# Patient Record
Sex: Female | Born: 1965 | Race: Black or African American | Hispanic: No | Smoking: Never smoker
Health system: Southern US, Community
[De-identification: ages and names within clinical notes are randomized; demographics above are authoritative.]

## PROBLEM LIST (undated history)

## (undated) HISTORY — PX: BREAST EXCISIONAL BIOPSY: SUR124

---

## 2007-05-26 ENCOUNTER — Ambulatory Visit (HOSPITAL_COMMUNITY): Admission: RE | Admit: 2007-05-26 | Discharge: 2007-05-26 | Payer: Self-pay | Admitting: Obstetrics and Gynecology

## 2007-12-03 ENCOUNTER — Encounter: Admission: RE | Admit: 2007-12-03 | Discharge: 2007-12-03 | Payer: Self-pay | Admitting: Surgery

## 2007-12-03 ENCOUNTER — Encounter (INDEPENDENT_AMBULATORY_CARE_PROVIDER_SITE_OTHER): Payer: Self-pay | Admitting: Diagnostic Radiology

## 2007-12-03 ENCOUNTER — Other Ambulatory Visit: Admission: RE | Admit: 2007-12-03 | Discharge: 2007-12-03 | Payer: Self-pay | Admitting: Diagnostic Radiology

## 2007-12-03 ENCOUNTER — Encounter (INDEPENDENT_AMBULATORY_CARE_PROVIDER_SITE_OTHER): Payer: Self-pay | Admitting: Interventional Radiology

## 2008-01-11 ENCOUNTER — Emergency Department (HOSPITAL_COMMUNITY): Admission: EM | Admit: 2008-01-11 | Discharge: 2008-01-11 | Payer: Self-pay | Admitting: Family Medicine

## 2008-01-14 ENCOUNTER — Ambulatory Visit: Payer: Self-pay | Admitting: Family Medicine

## 2008-01-22 ENCOUNTER — Ambulatory Visit: Payer: Self-pay | Admitting: Family Medicine

## 2009-02-20 IMAGING — US US SOFT TISSUE HEAD/NECK
1 series · 13 of 25 positions shown · non-contrast
Comparison: None.

CLINICAL DATA: Thyroid nodule on physical examination.

THYROID ULTRASOUND
TECHNIQUE: Ultrasound examination of the thyroid gland and adjacent
soft tissues was performed.

[Series 1: unknown · 0.11mm/px · 13 of 53 slices shown]
[im 1/53]
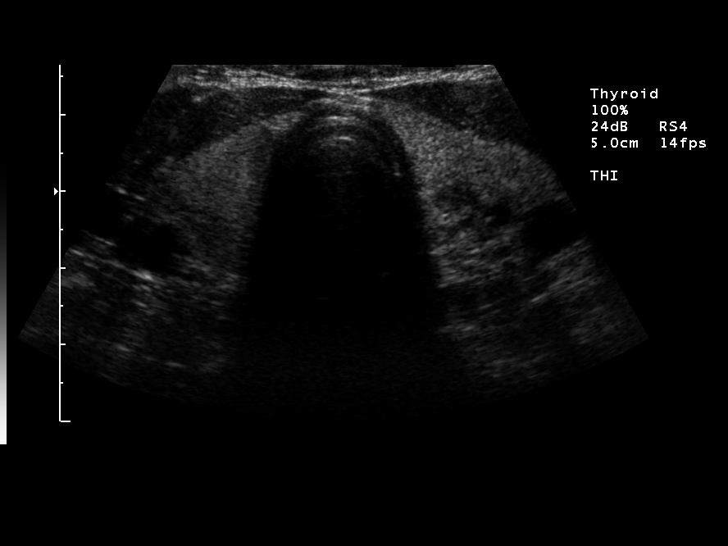
[im 5/53]
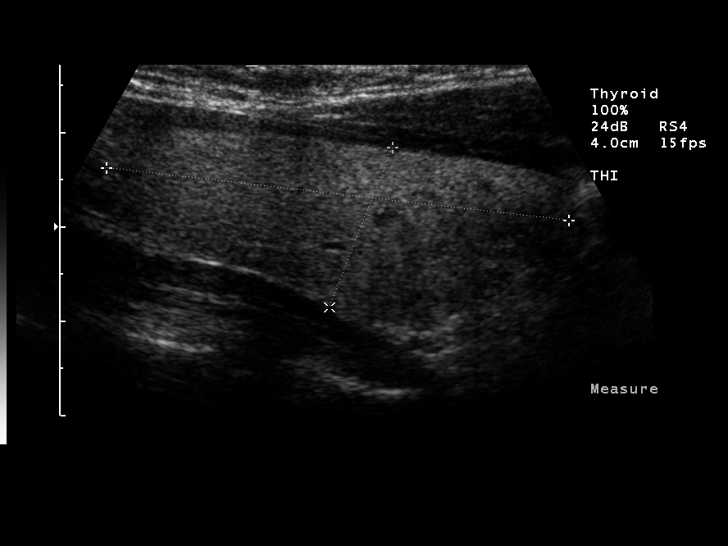
[im 9/53]
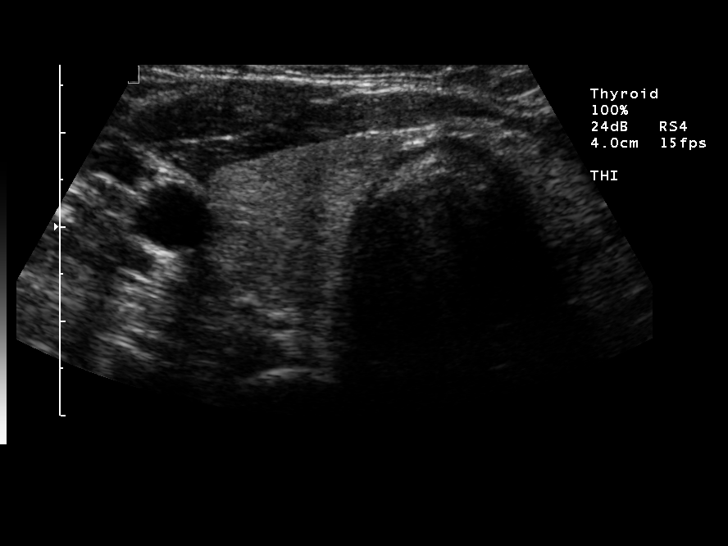
[im 14/53]
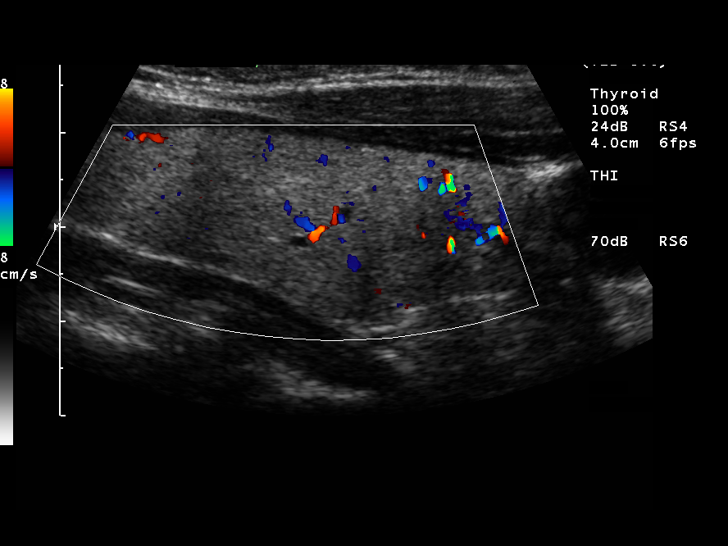
[im 18/53]
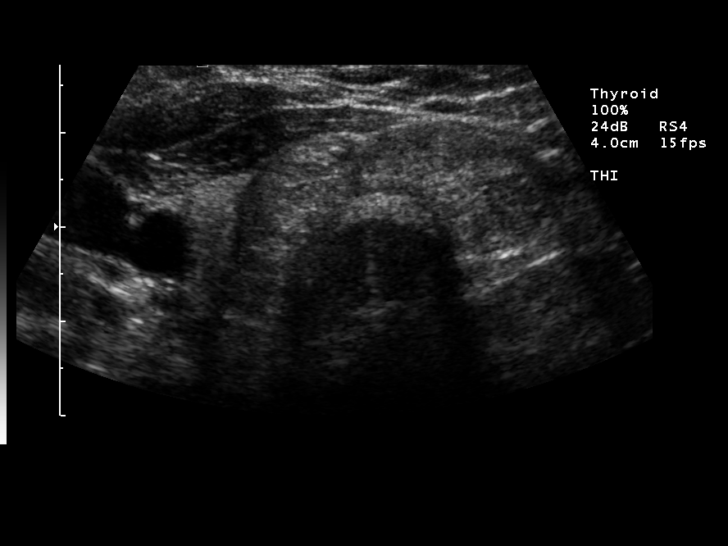
[im 22/53]
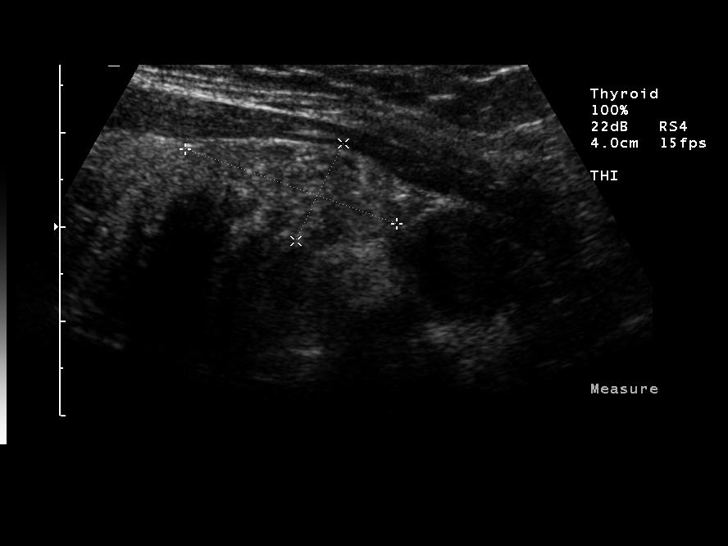
[im 27/53]
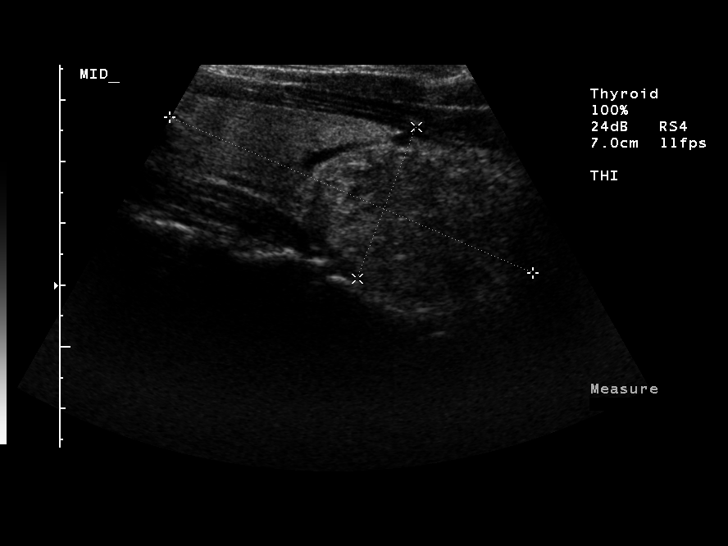
[im 31/53]
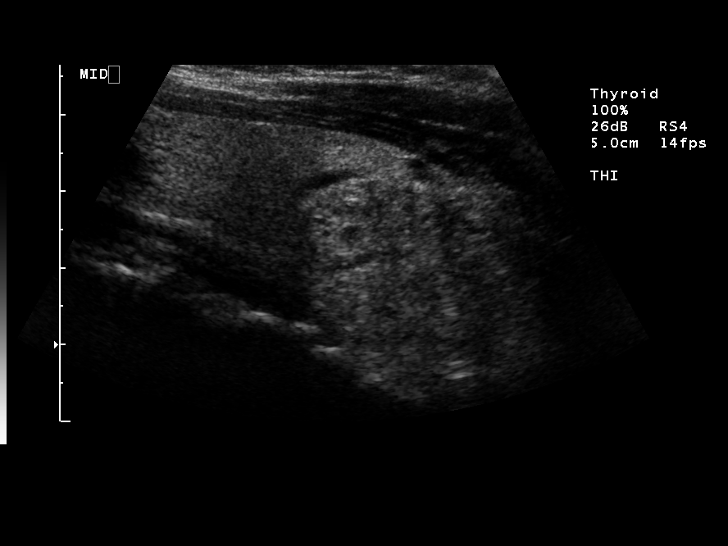
[im 35/53]
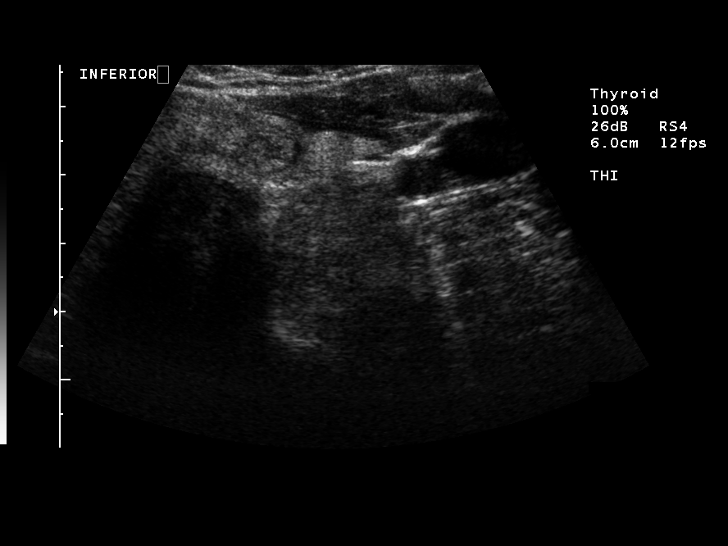
[im 40/53]
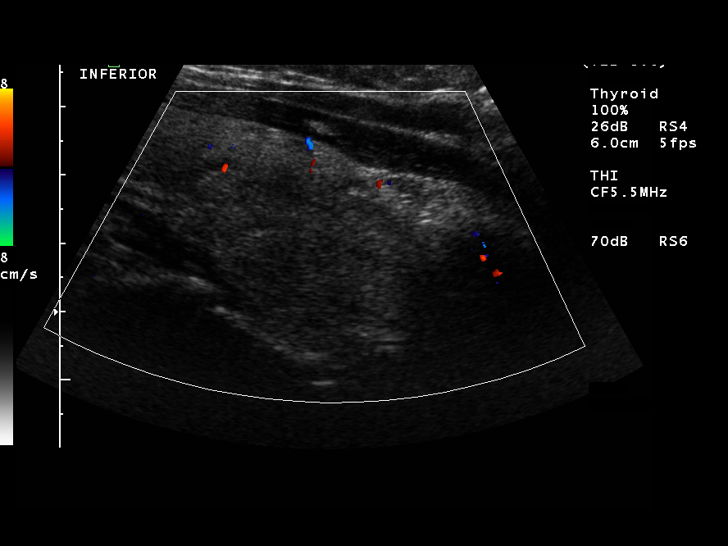
[im 44/53]
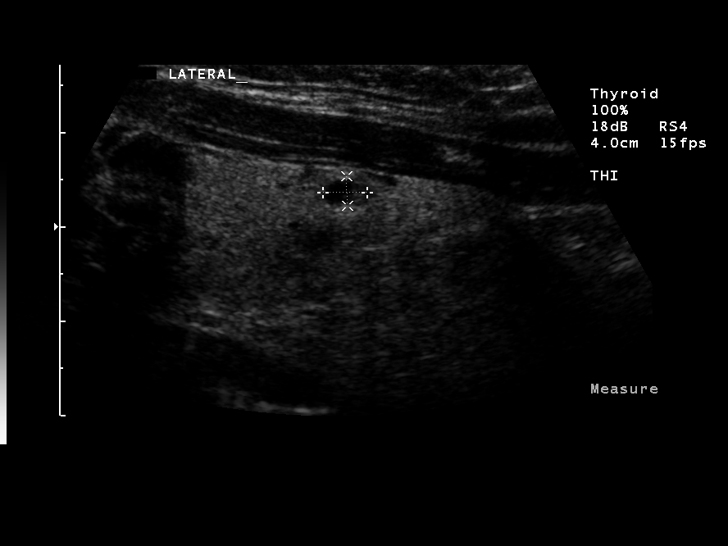
[im 48/53]
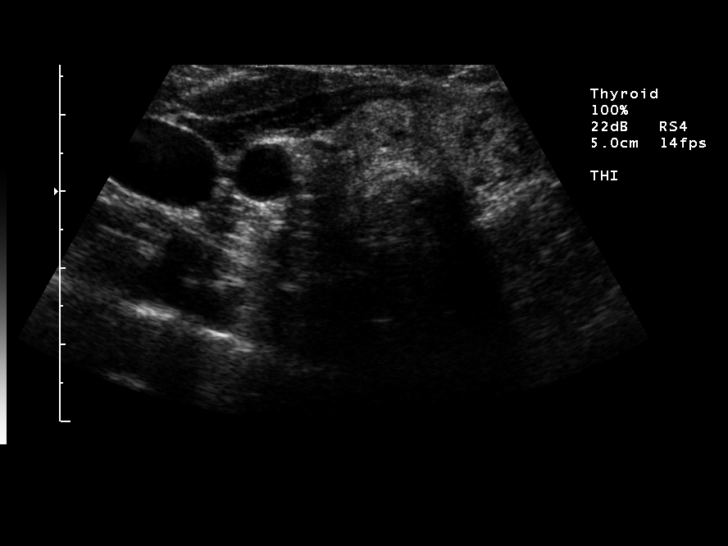
[im 53/53]
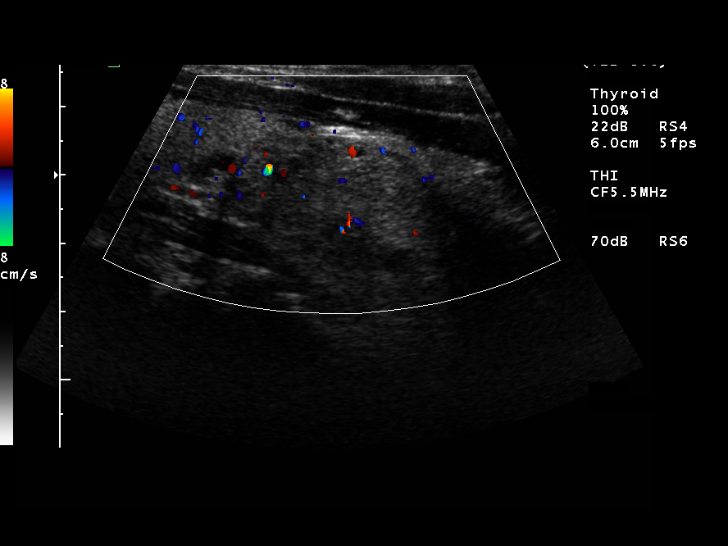

[13 of 25 positions shown; findings below may reference images not displayed]

FINDINGS: The left lobe of the thyroid gland is elongated by a
large mass arising from the inferior portion of the gland.  The
isthmus is also enlarged by a mass.  Otherwise, the thyroid gland
is normal in size.  The right lobe measures 4.9 x 1.8 x 1.6 cm in
maximum dimensions.  The left lobe measures 6.4 x 2.6 x 2.3 cm in
maximum dimensions.  The isthmus measures 9.6 mm in thickness in
the midline.

The mass arising from the inferior aspect of the left lobe measures
4.2 x 2.8 x 2.5 cm in maximum dimensions.  The mass in the isthmus
measures 2.8 x 2.4 x 1.2 cm in maximum dimensions.  There are
additional smaller masses in both lobes. The largest of these
masses is in the mid left lobe, measuring 1.0 cm in maximum
diameter.
IMPRESSION: Multinodular goiter, including a dominant inferior left lobe mass
measuring 4.2 cm in maximum diameter and dominant isthmus mass
measuring 2.8 cm in maximum diameter.  Consideration of ultrasound-
guided needle biopsy of both of these masses is recommended.

## 2009-03-16 ENCOUNTER — Ambulatory Visit: Payer: Self-pay | Admitting: Family Medicine

## 2009-08-30 IMAGING — US US BIOPSY
1 series · 14 of 16 positions shown · non-contrast
Comparison: none

CLINICAL HISTORY: Thyroid nodules.  Dominant nodules involving the
left lower pole and isthmus

[Series 1: us biopsy · 0.09mm/px · 16 acquisitions, 14 frames shown]
[im 1/16]
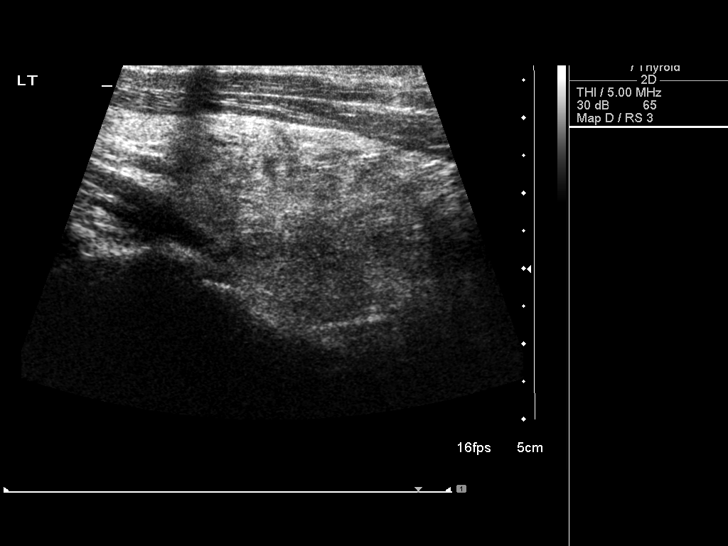
[im 2/16]
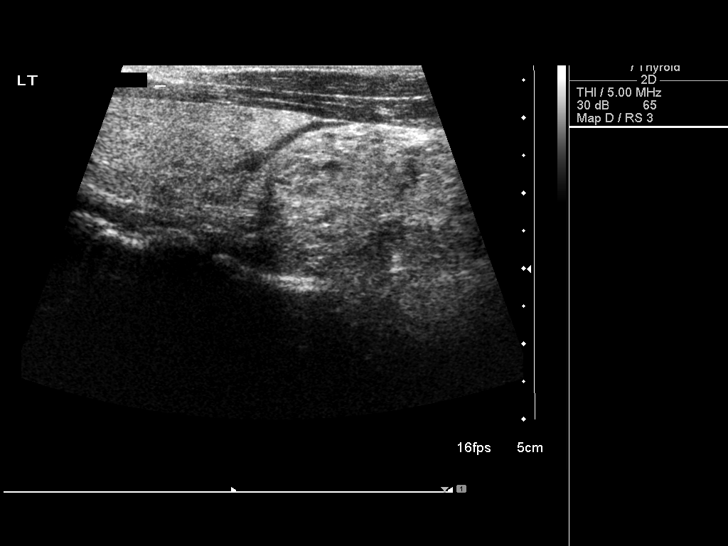
[im 3/16]
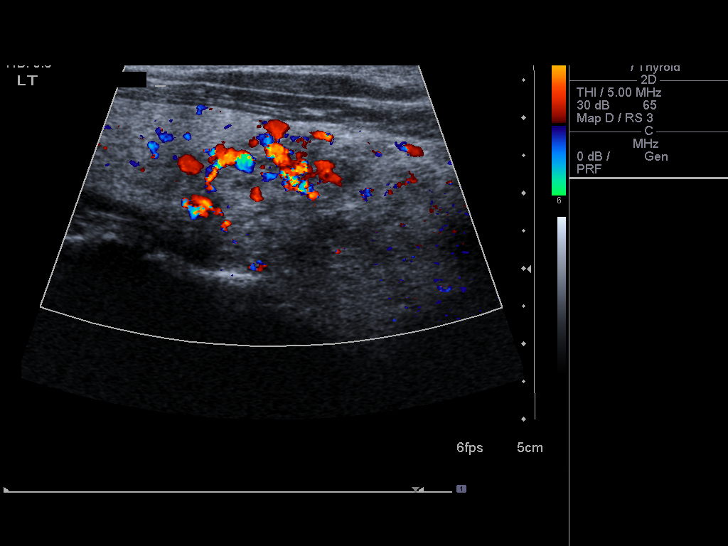
[im 5/16]
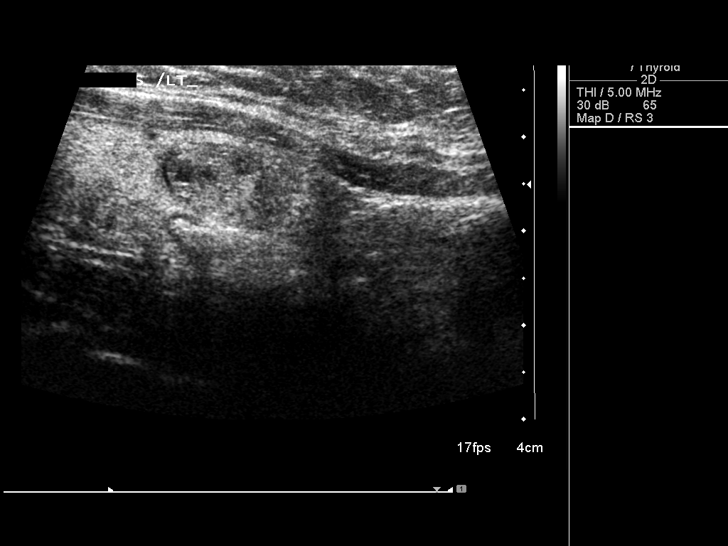
[im 6/16]
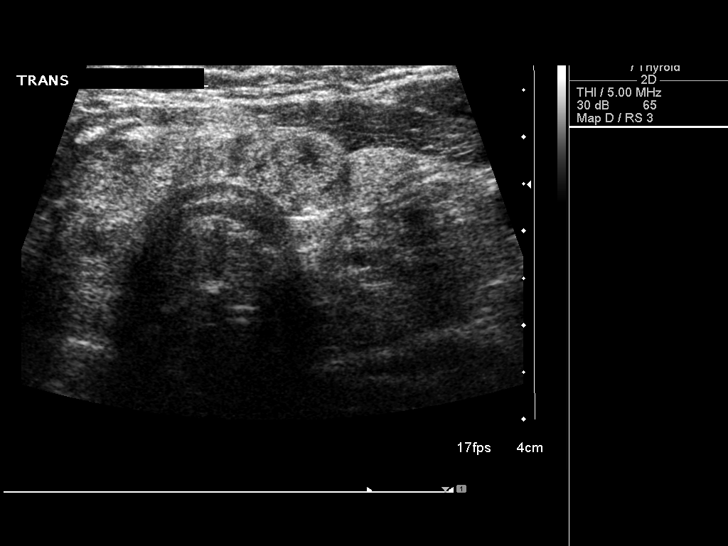
[im 7/16]
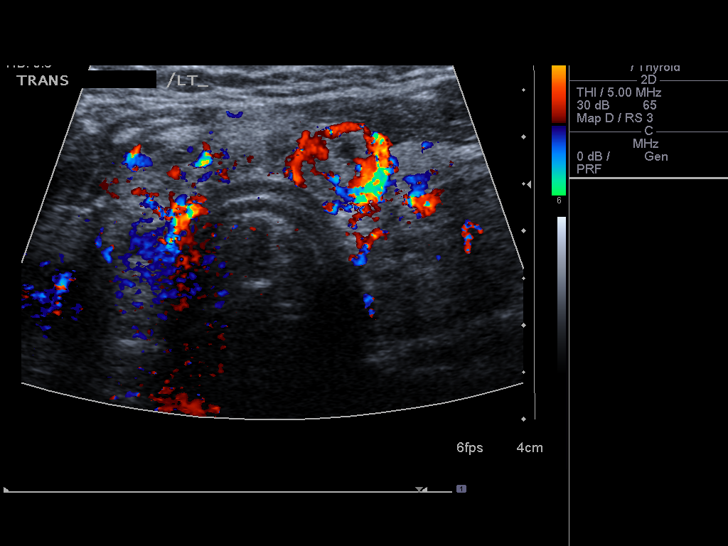
[im 8/16]
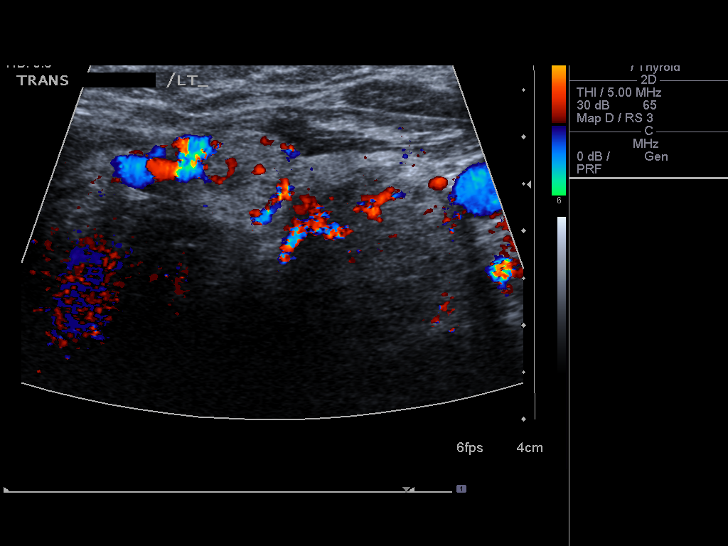
[im 9/16]
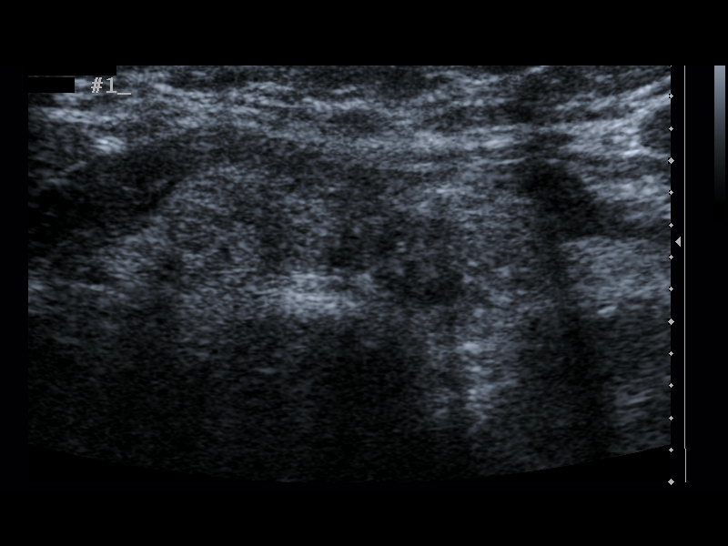
[im 10/16]
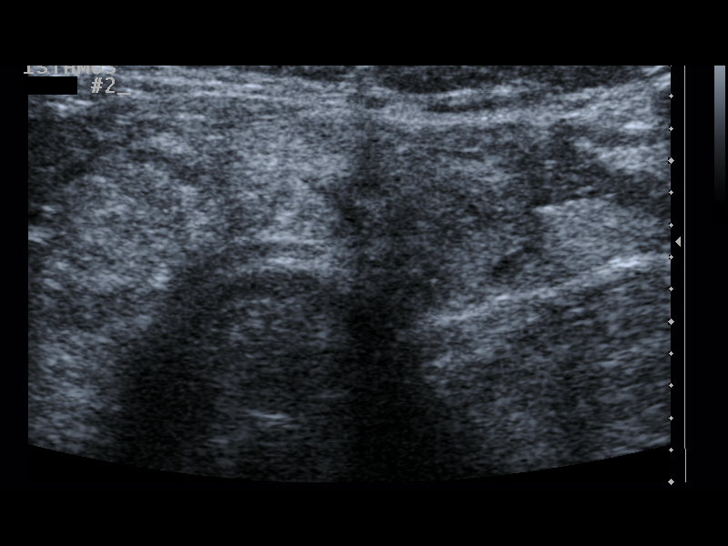
[im 11/16]
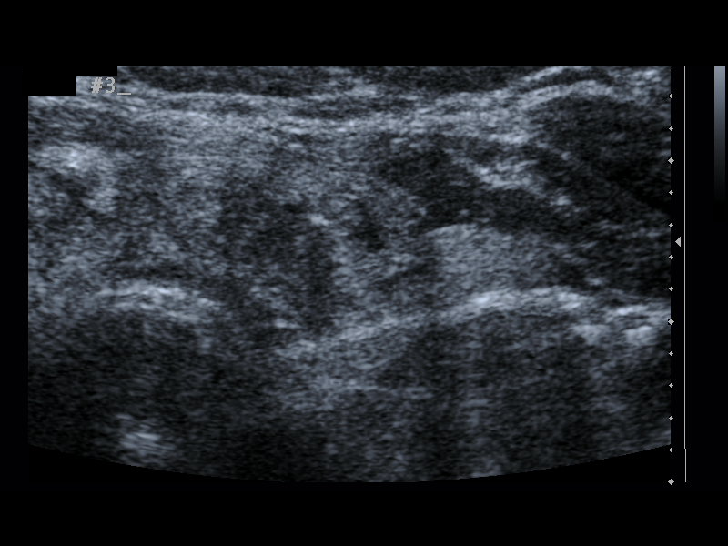
[im 13/16]
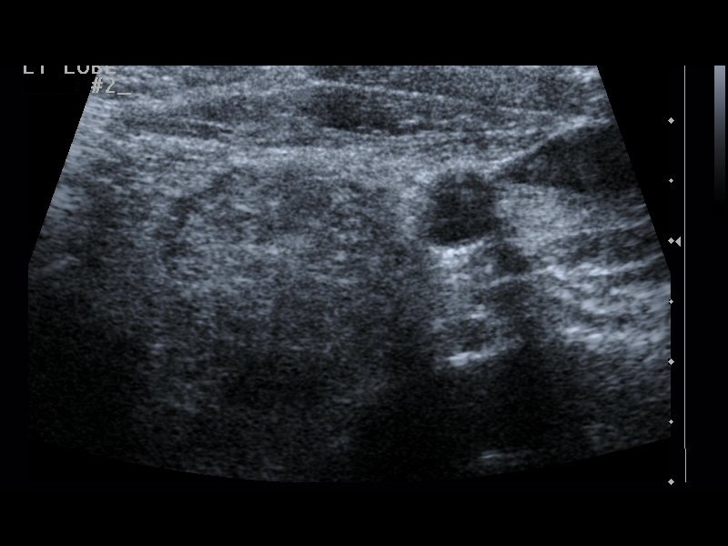
[im 14/16]
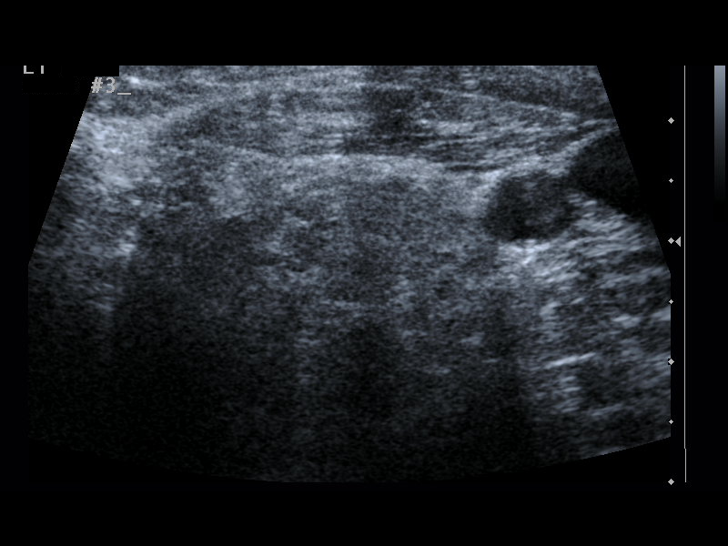
[im 15/16]
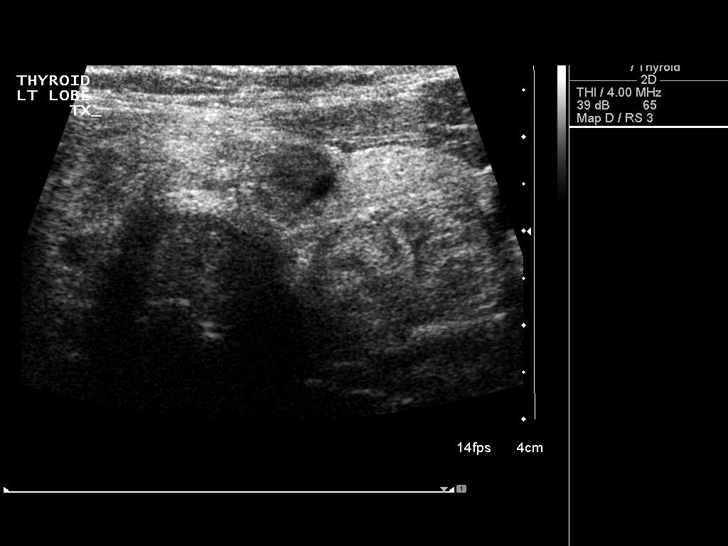
[im 16/16]
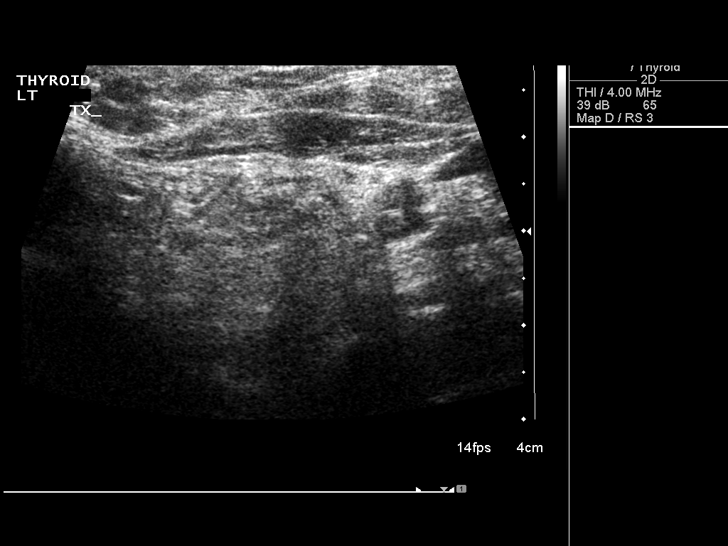

[14 of 16 positions shown; findings below may reference images not displayed]

PROCEDURE(S): ULTRASOUND GUIDED FINE NEEDLE ASPIRATIONS OF TWO
THYROID NODULES

Medications:None

Sedation time:None

Procedure:Consent was obtained for ultrasound guided thyroid
biopsies.  Ultrasound demonstrated the nodules of concern.  The
neck was prepped with Betadine and a sterile drape was placed.
The neck was anesthetized with 1% lidocaine.  Using ultrasound
guidance, the heterogeneous nodule along left side of the isthmus
was sampled three times with 25 gauge needles.  The larger nodule
along the left lower pole was sampled 3 times with 25 gauge
needles.  No immediate complications.
FINDINGS: Large heterogeneous nodule along the lower pole of the
left thyroid lobe.  Contiguous nodules involving the isthmus gland.
The left side of this nodular structure was biopsied.
IMPRESSION: Ultrasound guided fine needle aspirations of the isthmus
and left thyroid lobe nodules.

## 2010-08-07 ENCOUNTER — Other Ambulatory Visit (HOSPITAL_COMMUNITY): Payer: Self-pay | Admitting: Obstetrics and Gynecology

## 2010-08-07 DIAGNOSIS — E049 Nontoxic goiter, unspecified: Secondary | ICD-10-CM

## 2010-08-10 ENCOUNTER — Ambulatory Visit (HOSPITAL_COMMUNITY)
Admission: RE | Admit: 2010-08-10 | Discharge: 2010-08-10 | Disposition: A | Discharge status: Home / Self Care | Payer: Self-pay | Source: Ambulatory Visit | Attending: Obstetrics and Gynecology | Admitting: Obstetrics and Gynecology

## 2010-08-10 DIAGNOSIS — E049 Nontoxic goiter, unspecified: Secondary | ICD-10-CM

## 2011-11-07 ENCOUNTER — Other Ambulatory Visit: Payer: Self-pay | Admitting: Obstetrics and Gynecology

## 2011-11-28 ENCOUNTER — Telehealth (INDEPENDENT_AMBULATORY_CARE_PROVIDER_SITE_OTHER): Payer: Self-pay

## 2011-11-28 NOTE — Telephone Encounter (Signed)
I left msg for pt to call [facility]. Pt needs re-imaging prior to this appt. Last u/s was 2009. Pt may need to r/s so we can get imaging done.

## 2011-12-03 ENCOUNTER — Encounter (INDEPENDENT_AMBULATORY_CARE_PROVIDER_SITE_OTHER): Payer: Self-pay | Admitting: Surgery

## 2011-12-14 ENCOUNTER — Encounter (INDEPENDENT_AMBULATORY_CARE_PROVIDER_SITE_OTHER): Payer: Self-pay | Admitting: Surgery

## 2014-04-21 ENCOUNTER — Other Ambulatory Visit: Payer: Self-pay | Admitting: Obstetrics and Gynecology

## 2014-04-23 LAB — CYTOLOGY - PAP

## 2015-07-29 ENCOUNTER — Other Ambulatory Visit: Payer: Self-pay | Admitting: Obstetrics and Gynecology

## 2015-07-29 DIAGNOSIS — E049 Nontoxic goiter, unspecified: Secondary | ICD-10-CM

## 2015-08-02 ENCOUNTER — Other Ambulatory Visit: Payer: Self-pay

## 2015-08-05 ENCOUNTER — Ambulatory Visit
Admission: RE | Admit: 2015-08-05 | Discharge: 2015-08-05 | Disposition: A | Discharge status: Home / Self Care | Payer: PRIVATE HEALTH INSURANCE | Source: Ambulatory Visit | Attending: Obstetrics and Gynecology | Admitting: Obstetrics and Gynecology

## 2015-08-05 DIAGNOSIS — E049 Nontoxic goiter, unspecified: Secondary | ICD-10-CM

## 2015-09-20 ENCOUNTER — Encounter: Payer: Self-pay | Admitting: Internal Medicine

## 2015-09-20 ENCOUNTER — Other Ambulatory Visit: Payer: Self-pay | Admitting: Internal Medicine

## 2015-09-20 ENCOUNTER — Ambulatory Visit (INDEPENDENT_AMBULATORY_CARE_PROVIDER_SITE_OTHER): Payer: PRIVATE HEALTH INSURANCE | Admitting: Internal Medicine

## 2015-09-20 DIAGNOSIS — E042 Nontoxic multinodular goiter: Secondary | ICD-10-CM | POA: Diagnosis not present

## 2015-09-20 LAB — TSH: TSH: 1.36 u[IU]/mL (ref 0.35–4.50)

## 2015-09-20 LAB — T3, FREE: T3 FREE: 3.4 pg/mL (ref 2.3–4.2)

## 2015-09-20 LAB — T4, FREE: FREE T4: 1.07 ng/dL (ref 0.60–1.60)

## 2015-09-20 NOTE — Progress Notes (Signed)
Patient ID: Jacqueline Day, female   DOB: February 26, 1965, 50 y.o.   MRN: [id number]   HPI  Jacqueline Day is a 50 y.o.-year-old female, referred by Dr. 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  Patient was told that she has several thyroid nodules in 2009. A repeat ultrasound in 2017 shows that these nodules have grown and also she has developed new nodules.  Thyroid U/S (05/26/2007): 2 dominant nodules: Left lobe and isthmic:  The mass arising from the inferior aspect of the left lobe measures 4.2 x 2.8 x 2.5 cm in maximum dimensions.    The mass in the isthmus measures 2.8 x 2.4 x 1.2 cm in maximum dimensions.    There are additional smaller masses in both lobes. The largest of these masses is in the mid left lobe, measuring 1.0 cm in maximum diameter.   2 thyroid FNAs (11/2007): Benign   Thyroid U/S (08/05/2015): The previous nodules have grown in size and also she has new nodules.  A new nodule is noted in the midportion of the right lobe of the thyroid posteriorly. It measures 1.4 x 0.8 x 0.9 cm. It demonstrates some cystic components within.  *A small nodule labeled #1 Within the midportion of the left lobe of thyroid measures 2.3 x 1.1 x 2.1 cm. This was not well appreciated on the prior exam.  *A larger nodule labeled #2 measures 6.8 x 4.1 x 4.6 cm. It previously measured 4.2 x 2.8 x 2.5 cm. This nodule has been previously biopsied and shown to be benign.  Nodule #1 in the isthmus is noted superiorly and somewhat eccentric to the left. It measures 1.9 x 1.4 x 1.5 cm. It demonstrates some central cystic components consistent with a complex nodule.  *Nodule #2 lies eccentric to the right and inferiorly within the isthmus and measures 3.2 x 1.5 x 2.9 cm. It previously measured 2.4 x 1.2 x 2.8 cm. This is a slight increase in size of approximately 8 mm. This lesion has also been previously biopsied and shown to be benign.  Pt denies: - feeling nodules in neck -  hoarseness - dysphagia - choking - SOB with lying down   I reviewed pt's most recent thyroid tests: 04/21/2014: TSH 1.117, free T4 1.14  Pt denies: - fatigue - heat intolerance/cold intolerance - tremors - palpitations - anxiety/depression - hyperdefecation/constipation - weight loss/weight gain - dry skin - hair loss  + FH of thyroid ds.: Thyroiditis in cousin. No FH of thyroid cancer. No h/o radiation tx to head or neck.  No seaweed or kelp. No recent contrast studies. No steroid use. No herbal supplements. No Biotin supplements or Hair, Skin and Nails vitamins.  No other medical hx.  ROS: Constitutional: See history of present illness: Eyes: no blurry vision, no xerophthalmia ENT: no sore throat, no nodules palpated in throat, no dysphagia/odynophagia, no hoarseness Cardiovascular: no CP/SOB/palpitations/leg swelling Respiratory: no cough/SOB Gastrointestinal: no N/V/D/C Musculoskeletal: no muscle/joint aches Skin: no rashes Neurological: no tremors/numbness/tingling/dizziness Psychiatric: no depression/anxiety  No past medical history other than MNG.  No past surgical history.  Social History   Social History  . Marital status: Divorced    Spouse name: N/A  . Number of children: 1   Occupational History  . Buyer   Social History Main Topics  . Smoking status: Never Smoker  . Smokeless tobacco: No  . Alcohol use No  . Drug use: No    Medications: - Ergocalciferol 50,000 units weekly   No  Known Allergies   Family history:  - Hyperlipidemia in mother   PE: BP 134/84 (BP Location: Left Arm, Patient Position: Sitting)   Pulse 70   Ht 5\' 7"  (1.702 m)   Wt 269 lb (122 kg)   LMP 08/05/2015   BMI 42.13 kg/m  Wt Readings from Last 3 Encounters:  09/20/15 269 lb (122 kg)   Constitutional: overweight, in NAD Eyes: PERRLA, EOMI, no exophthalmos ENT: moist mucous membranes, + thyromegaly, + isthmic nodule easily visible and palpated, left thyroid  nodule also palpable, no cervical lymphadenopathy Cardiovascular: RRR, No MRG Respiratory: CTA B Gastrointestinal: abdomen soft, NT, ND, BS+ Musculoskeletal: no deformities, strength intact in all 4;  Skin: moist, warm, no rashes Neurological: no tremor with outstretched hands, DTR normal in all 4  ASSESSMENT: 1. Multiple thyroid nodules  PLAN: 1. Multiple thyroid nodules - I reviewed the images of her thyroid ultrasound along with the patient. I pointed out that the dominant nodules are large, this being a risk factor for cancer. Also, the nodules have increased significantly compared to the thyroid ultrasound 8 years ago. She also has some new nodules. The nodules are: - without microcalcifications - without internal blood flow - more wide than tall Pt does not have a thyroid cancer family history or a personal history of RxTx to head/neck.  - Interestingly, despite the size of the nodules, she has absolutely no neck compression symptoms. In this case, thyroid surgeries only indicated if the nodules are malignant. I suggested to repeat the thyroid biopsy on the 2 nodules that have been biopsied before and also to add one other biopsy  of the 2.3 cm left thyroid nodule. The other isthmic nodule appears mostly cystic and has benign features. She reluctantly agrees to the new biopsies. -I explained that if this is not cancer, we can continue to follow her on a yearly basis, and check another ultrasound in another year or 2. - I'll see her back in a year, assuming her FNA is normal. If FNA abnormal, we will meet sooner.  - I advised pt to join my chart and I will send her the results through there  - We will also check a new set of thyroid tests today   Component     Latest Ref Rng & Units 09/20/2015  T4,Free(Direct)     0.60 - 1.60 ng/dL 1.61  TSH     0.96 - 0.45 uIU/mL 1.36  Triiodothyronine,Free,Serum     2.3 - 4.2 pg/mL 3.4   TFTs are normal.  Thyroid FNA pending.I will addend  the results when they become available.  Carlus Pavlov, MD PhD [facility]

## 2015-09-20 NOTE — Patient Instructions (Signed)
We will schedule your new thyroid biopsy and call you with the time and date.  Please stop at the lab.  Please return in 1 year.

## 2015-09-21 ENCOUNTER — Telehealth: Payer: Self-pay

## 2015-09-21 NOTE — Telephone Encounter (Signed)
Attempted to contact patient to advise of normal lab results. Patient number we have on file is the wrong number, Called work number no answer and no voicemail to leave message.

## 2015-09-26 ENCOUNTER — Telehealth: Payer: Self-pay

## 2015-09-26 NOTE — Telephone Encounter (Signed)
Attempted to contact patient via work number, no answer and no voicemail. Will mail results to patient.

## 2015-09-28 ENCOUNTER — Telehealth: Payer: Self-pay

## 2015-09-28 NOTE — Telephone Encounter (Signed)
[facility] called back and they said to disregard the previous message they WOULD be able to do all three!

## 2015-09-28 NOTE — Telephone Encounter (Signed)
I certainly hope so!

## 2015-09-28 NOTE — Telephone Encounter (Signed)
[facility] called and notified that the patient has an order in epic to have three biopsy's completed. They advised me that they were only doing one because it was the only one that qualified and it was the left thyroid. They stated that if you had any questions or issues to call the radiologist who read it Dr.Luken. They wanted to notify you.

## 2015-10-24 ENCOUNTER — Telehealth: Payer: Self-pay

## 2015-10-24 ENCOUNTER — Telehealth: Payer: Self-pay | Admitting: Internal Medicine

## 2015-10-24 NOTE — Telephone Encounter (Signed)
[facility] called, states that Dr.McCullogh will biopsy the new found nodule and only one of the old nodules that have already been biopsied. They stated if you disagreed that Dr.McCullogh will be glad to talk to you about it. Please advise on if you are okay with this or would like to discuss with the Dr so I can let [facility]ache[facility] know [facility].

## 2015-10-24 NOTE — Telephone Encounter (Signed)
OK 

## 2015-10-24 NOTE — Telephone Encounter (Signed)
Arena from the [facility] center has questions about Thyroid Biospy. Please advise [phone]

## 2015-10-24 NOTE — Telephone Encounter (Signed)
Called Arena to discuss thyroid ultrasound, left message and gave call back number.

## 2015-10-25 ENCOUNTER — Telehealth: Payer: Self-pay

## 2015-10-25 NOTE — Telephone Encounter (Signed)
Called Arena from imaging. Stated Dr.Gherghe was okay with the plan so far. I advised her to call back if any other questions or concerns.

## 2016-08-13 ENCOUNTER — Other Ambulatory Visit: Payer: Self-pay | Admitting: Obstetrics and Gynecology

## 2016-08-13 DIAGNOSIS — R928 Other abnormal and inconclusive findings on diagnostic imaging of breast: Secondary | ICD-10-CM

## 2016-08-15 ENCOUNTER — Other Ambulatory Visit: Payer: Self-pay | Admitting: Obstetrics and Gynecology

## 2016-08-15 ENCOUNTER — Ambulatory Visit
Admission: RE | Admit: 2016-08-15 | Discharge: 2016-08-15 | Disposition: A | Discharge status: Home / Self Care | Payer: PRIVATE HEALTH INSURANCE | Source: Ambulatory Visit | Attending: Obstetrics and Gynecology | Admitting: Obstetrics and Gynecology

## 2016-08-15 DIAGNOSIS — R928 Other abnormal and inconclusive findings on diagnostic imaging of breast: Secondary | ICD-10-CM

## 2016-08-16 ENCOUNTER — Other Ambulatory Visit: Payer: Self-pay | Admitting: Obstetrics and Gynecology

## 2016-08-16 ENCOUNTER — Ambulatory Visit
Admission: RE | Admit: 2016-08-16 | Discharge: 2016-08-16 | Disposition: A | Discharge status: Home / Self Care | Payer: PRIVATE HEALTH INSURANCE | Source: Ambulatory Visit | Attending: Obstetrics and Gynecology | Admitting: Obstetrics and Gynecology

## 2016-08-16 DIAGNOSIS — R928 Other abnormal and inconclusive findings on diagnostic imaging of breast: Secondary | ICD-10-CM

## 2016-08-17 ENCOUNTER — Encounter: Payer: Self-pay | Admitting: Internal Medicine

## 2016-08-17 NOTE — Progress Notes (Signed)
Received labs from Dr. Arelia SneddonMcComb: TSH 1.07, total T4 9.5

## 2016-09-19 ENCOUNTER — Ambulatory Visit: Payer: PRIVATE HEALTH INSURANCE | Admitting: Internal Medicine

## 2017-05-02 IMAGING — US US THYROID
1 series · 13 of 25 positions shown · non-contrast
Comparison: 05/26/2007

CLINICAL DATA: Known thyroid nodules and recent enlargement of the
thyroid

EXAM:
THYROID ULTRASOUND
TECHNIQUE: Ultrasound examination of the thyroid gland and adjacent soft
tissues was performed.

[Series 1: us thyroid · 0.08mm/px · 13 of 68 slices shown]
[im 1/68]
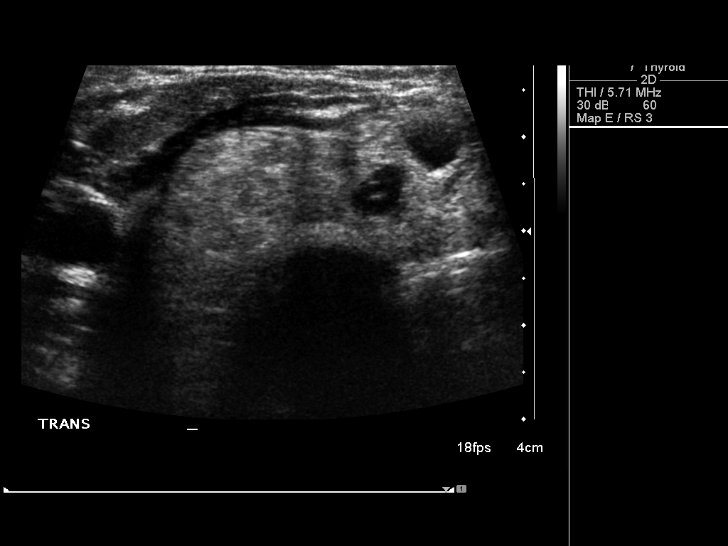
[im 6/68]
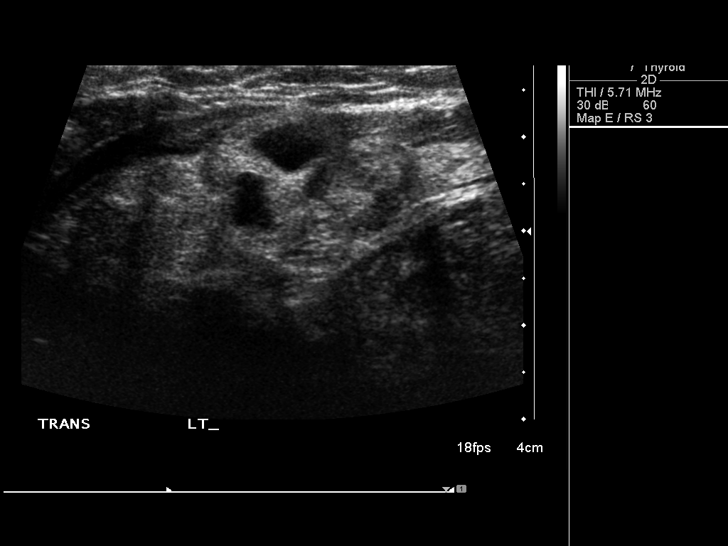
[im 12/68]
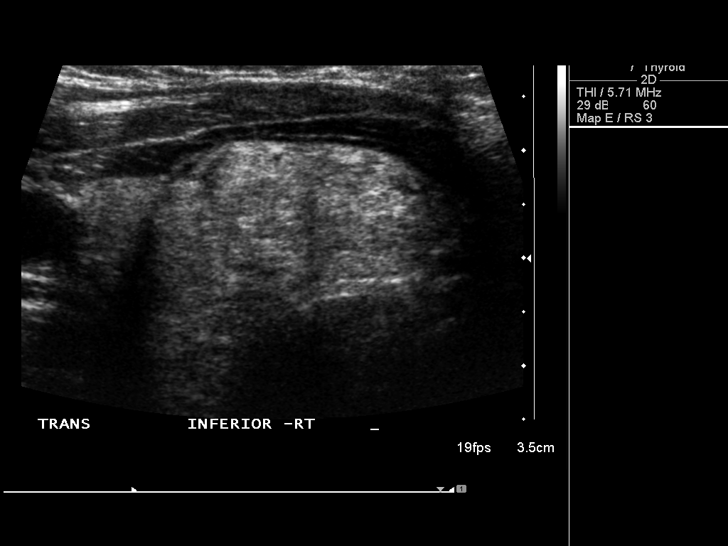
[im 17/68]
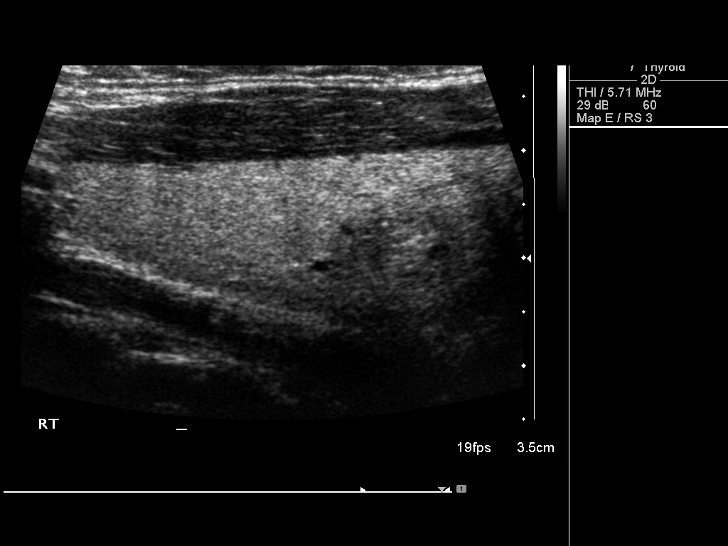
[im 23/68]
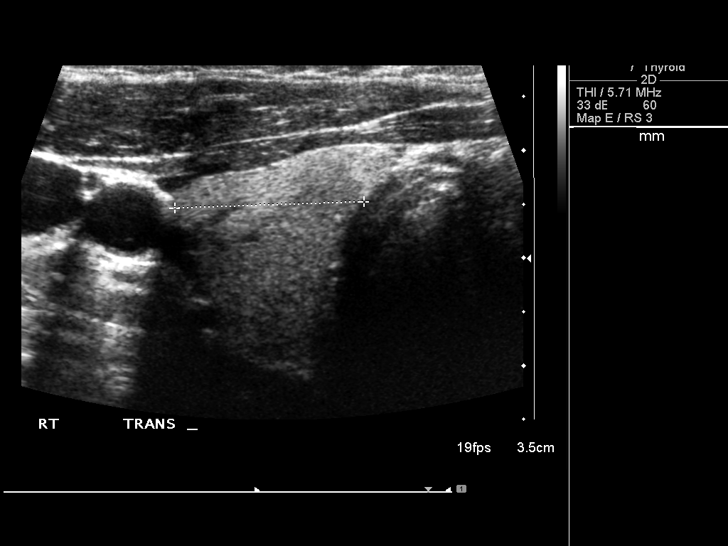
[im 28/68]
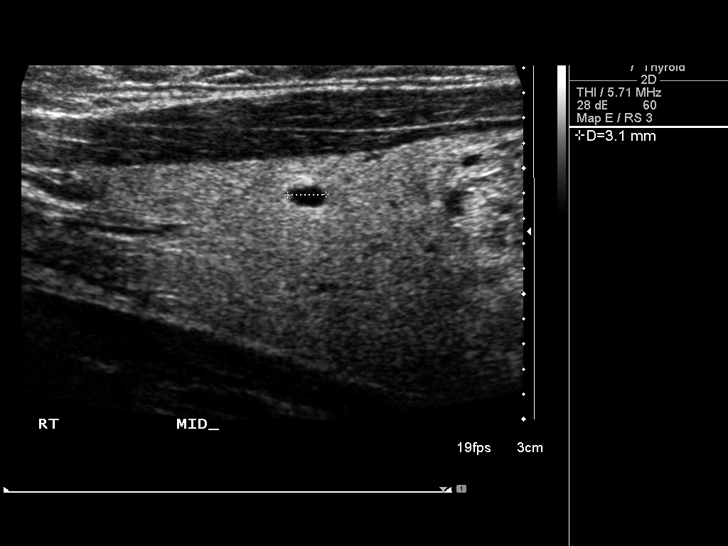
[im 34/68]
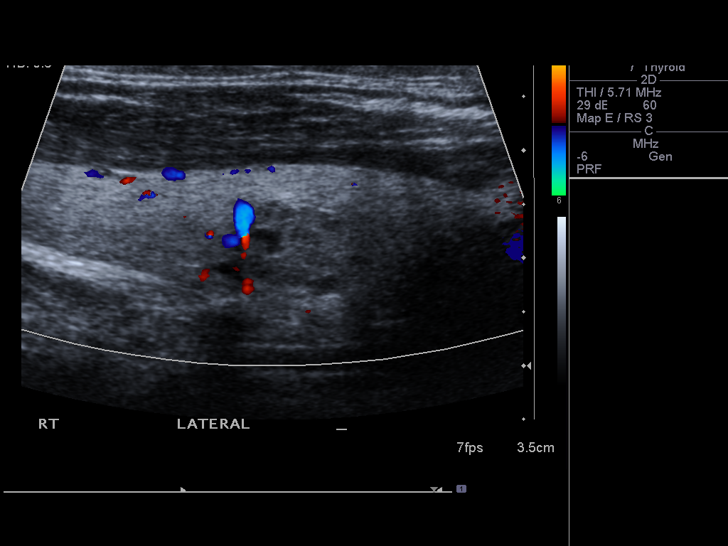
[im 40/68]
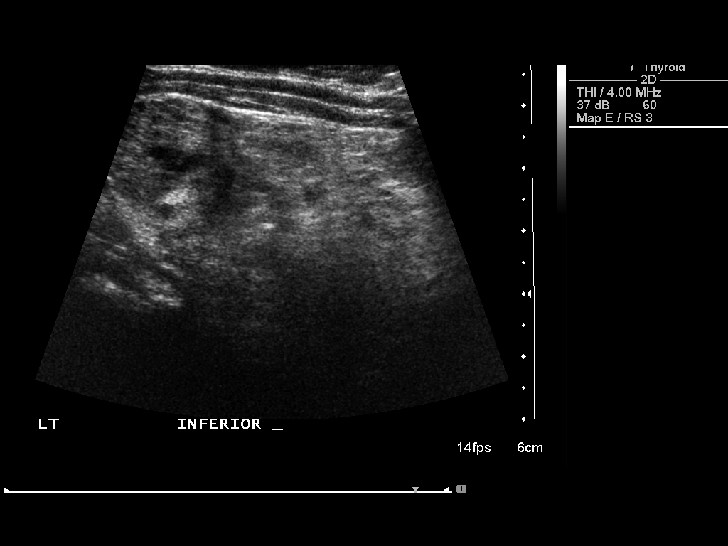
[im 45/68]
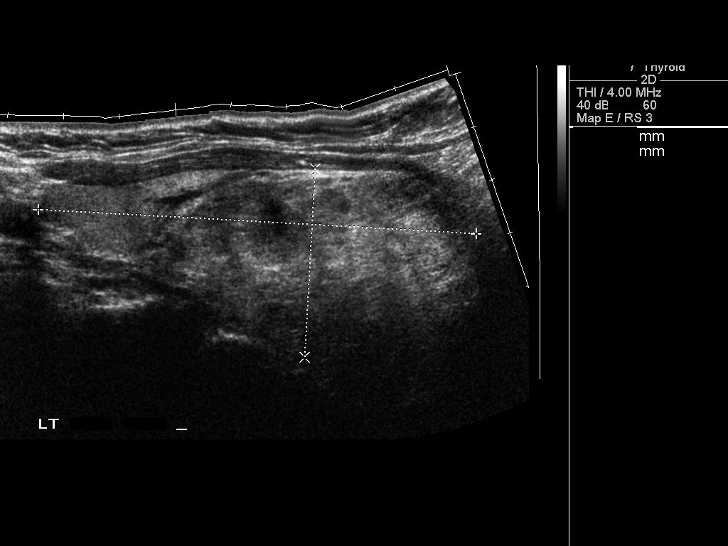
[im 51/68]
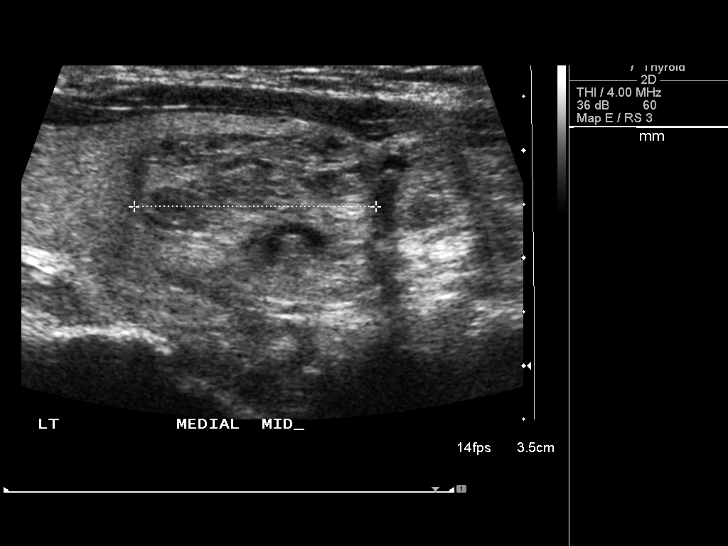
[im 56/68]
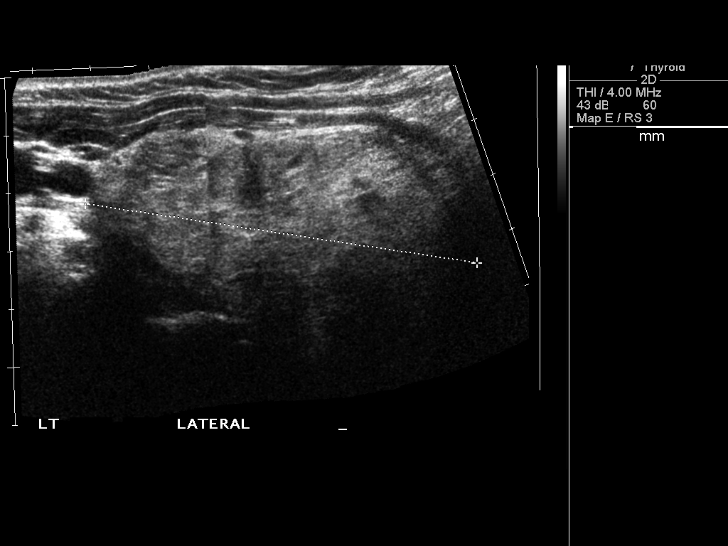
[im 62/68]
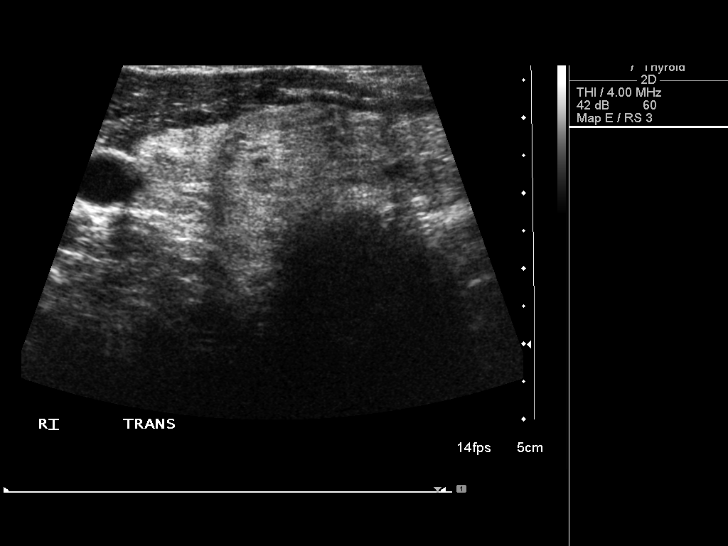
[im 68/68]
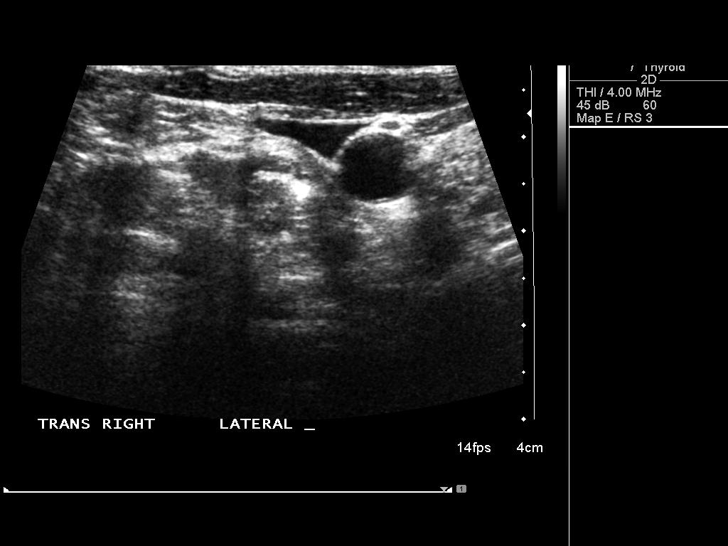

[13 of 25 positions shown; findings below may reference images not displayed]

FINDINGS: Right thyroid lobe

Measurements: 4.7 x 2.2 x 1.8 cm. This is roughly stable from the
prior examination.. A new nodule is noted in the midportion of the
right lobe of the thyroid posteriorly. It measures 1.4 x 0.8 x
cm. It demonstrates some cystic components within.

Left thyroid lobe

Measurements: 7.8 x 3.4 x 4.2 cm. The left lobe itself has enlarged
by approximately 1.4 cm in greatest dimension. A small nodule
labeled #1 Within the midportion of the left lobe of thyroid
measures 2.3 x 1.1 x 2.1 cm. This was not well appreciated on the
prior exam. A larger nodule labeled #2 measures 6.8 x 4.1 x 4.6 cm.
It previously measured 4.2 x 2.8 x 2.5 cm. This nodule has been
previously biopsied and shown to be benign.

Isthmus

Thickness: 0.9 cm. Nodule #1 in the isthmus is noted superiorly and
somewhat eccentric to the left. It measures 1.9 x 1.4 x 1.5 cm. It
demonstrates some central cystic components consistent with a
complex nodule. Nodule #2 lies eccentric to the right and inferiorly
within the isthmus and measures 3.2 x 1.5 x 2.9 cm. It previously
measured 2.4 x 1.2 x 2.8 cm. This is a slight increase in size of
approximately 8 mm. This lesion has also been previously biopsied
and shown to be benign.

Lymphadenopathy

None visualized.
IMPRESSION: Changes consistent with multinodular goiter. Previously biopsied
nodules on the left and in the isthmus eccentric to the right show
some growth in the interval from 1772 but have been shown to be
benign previously.

New nodule in the right lobe of the thyroid inferiorly measures
cm and does not meet biopsy criteria at this time.

New left mid thyroid nodule measuring 2.3 cm does meet criteria for
percutaneous biopsy at this time.

## 2017-06-04 ENCOUNTER — Other Ambulatory Visit: Payer: Self-pay | Admitting: Family Medicine

## 2017-06-04 DIAGNOSIS — Z139 Encounter for screening, unspecified: Secondary | ICD-10-CM

## 2022-07-16 ENCOUNTER — Ambulatory Visit: Payer: PRIVATE HEALTH INSURANCE | Attending: Sports Medicine
